# Patient Record
Sex: Male | Born: 2005
Health system: Southern US, Community
[De-identification: ages and names within clinical notes are randomized; demographics above are authoritative.]

---
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
## 2024-05-03 ENCOUNTER — Encounter (HOSPITAL_BASED_OUTPATIENT_CLINIC_OR_DEPARTMENT_OTHER): Payer: Self-pay | Admitting: Emergency Medicine

## 2024-05-03 ENCOUNTER — Emergency Department (HOSPITAL_BASED_OUTPATIENT_CLINIC_OR_DEPARTMENT_OTHER): Admitting: Radiology

## 2024-05-03 ENCOUNTER — Other Ambulatory Visit: Payer: Self-pay

## 2024-05-03 ENCOUNTER — Emergency Department (HOSPITAL_BASED_OUTPATIENT_CLINIC_OR_DEPARTMENT_OTHER)

## 2024-05-03 ENCOUNTER — Emergency Department (HOSPITAL_BASED_OUTPATIENT_CLINIC_OR_DEPARTMENT_OTHER)
Admission: EM | Admit: 2024-05-03 | Discharge: 2024-05-03 | Disposition: A | Discharge status: Home / Self Care | Attending: Emergency Medicine | Admitting: Emergency Medicine

## 2024-05-03 DIAGNOSIS — R410 Disorientation, unspecified: Secondary | ICD-10-CM | POA: Insufficient documentation

## 2024-05-03 LAB — COMPREHENSIVE METABOLIC PANEL WITH GFR
ALT: 21 U/L (ref 0–44)
AST: 21 U/L (ref 15–41)
Albumin: 4.5 g/dL (ref 3.5–5.0)
Alkaline Phosphatase: 46 U/L (ref 38–126)
Anion gap: 10 (ref 5–15)
BUN: 15 mg/dL (ref 6–20)
CO2: 26 mmol/L (ref 22–32)
Calcium: 9.7 mg/dL (ref 8.9–10.3)
Chloride: 103 mmol/L (ref 98–111)
Creatinine, Ser: 0.93 mg/dL (ref 0.61–1.24)
GFR, Estimated: >60 mL/min (ref >60)
Glucose, Bld: 92 mg/dL (ref 70–99)
Potassium: 4.3 mmol/L (ref 3.5–5.1)
Sodium: 139 mmol/L (ref 135–145)
Total Bilirubin: 0.5 mg/dL (ref 0.0–1.2)
Total Protein: 6.8 g/dL (ref 6.5–8.1)

## 2024-05-03 LAB — URINALYSIS, W/ REFLEX TO CULTURE (INFECTION SUSPECTED)
Bacteria, UA: NONE SEEN
Bilirubin Urine: NEGATIVE
Glucose, UA: NEGATIVE mg/dL
Hgb urine dipstick: NEGATIVE
Ketones, ur: NEGATIVE mg/dL
Leukocytes,Ua: NEGATIVE
Nitrite: NEGATIVE
Protein, ur: NEGATIVE mg/dL
Specific Gravity, Urine: 1.013 (ref 1.005–1.030)
pH: 7 (ref 5.0–8.0)

## 2024-05-03 LAB — URINE DRUG SCREEN
Amphetamines: NEGATIVE
Barbiturates: NEGATIVE
Benzodiazepines: NEGATIVE
Cocaine: NEGATIVE
Fentanyl: NEGATIVE
Methadone Scn, Ur: NEGATIVE
Opiates: NEGATIVE
Tetrahydrocannabinol: NEGATIVE

## 2024-05-03 LAB — CBC WITH DIFFERENTIAL/PLATELET
Abs Immature Granulocytes: 0.01 K/uL (ref 0.00–0.07)
Basophils Absolute: 0 K/uL (ref 0.0–0.1)
Basophils Relative: 1 %
Eosinophils Absolute: 0.1 K/uL (ref 0.0–0.5)
Eosinophils Relative: 1 %
HCT: 38.4 % — ABNORMAL LOW (ref 39.0–52.0)
Hemoglobin: 13.1 g/dL (ref 13.0–17.0)
Immature Granulocytes: 0 %
Lymphocytes Relative: 34 %
Lymphs Abs: 2.4 K/uL (ref 0.7–4.0)
MCH: 30.8 pg (ref 26.0–34.0)
MCHC: 34.1 g/dL (ref 30.0–36.0)
MCV: 90.4 fL (ref 80.0–100.0)
Monocytes Absolute: 0.4 K/uL (ref 0.1–1.0)
Monocytes Relative: 6 %
Neutro Abs: 4 K/uL (ref 1.7–7.7)
Neutrophils Relative %: 58 %
Platelets: 178 K/uL (ref 150–400)
RBC: 4.25 MIL/uL (ref 4.22–5.81)
RDW: 13 % (ref 11.5–15.5)
WBC: 6.9 K/uL (ref 4.0–10.5)
nRBC: 0 % (ref 0.0–0.2)

## 2024-05-03 LAB — TSH: TSH: 1.47 u[IU]/mL (ref 0.350–4.500)

## 2024-05-03 MED ORDER — SODIUM CHLORIDE 0.9 % IV BOLUS
1000.0000 mL | Freq: Once | INTRAVENOUS | Status: AC
  Administered 2024-05-03: 1000 mL via INTRAVENOUS

## 2024-05-03 NOTE — ED Triage Notes (Signed)
 Just dont feel like myself  feel like I'm in a daze  Just feel off  Started Sunday Was seen for similar in September resolved on own  Reports etoh prior symptoms starting

## 2024-05-03 NOTE — ED Provider Notes (Signed)
 [facility] AT [facility] Provider Note   CSN: [id number] Arrival date & time: 05/03/24  1713     Patient presents with: Altered Mental Status   Daniel Dudley is a 18 y.o. male.   Patient is a 18 year old male with no significant past medical history who presents with confusion.  He says he feels off.  This started about a week ago.  He said when he is doing things at work he feels like he is confused or he is not focusing on what he is doing.  He said his brain just feels fuzzy.  This started on Sunday after he had had a migraine the day prior.  He also had had about 7 alcoholic beverages the night before.  He denies any recent head injuries.  No nausea or vomiting.  No numbness or weakness to his extremities.  He does not feel off balance.  He said this happened in September and lasted about a week and then went away.  It also started after he drink some alcoholic beverages.  He does remember if he had a migraine at that time as well.  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  He had prescribed him propranolol but it did not seem to help.  He was noted to be hypoxic when he walked back to the room.  He is not reporting any feeling of shortness of breath.  No cough or cold symptoms.  No leg pain or swelling.  His mom has not noticed any confusion.       Prior to Admission medications   Not on File    Allergies: Patient has no known allergies.    Review of Systems  Constitutional:  Negative for chills, diaphoresis, fatigue and fever.  HENT:  Negative for congestion, rhinorrhea and sneezing.   Eyes: Negative.   Respiratory:  Negative for cough, chest tightness and shortness of breath.   Cardiovascular:  Negative for chest pain and leg swelling.  Gastrointestinal:  Negative for abdominal pain, diarrhea, nausea and vomiting.  Genitourinary:  Negative for difficulty urinating, flank pain  and frequency.  Musculoskeletal:  Negative for arthralgias and back pain.  Skin:  Negative for rash.  Neurological:  Negative for dizziness, speech difficulty, weakness, numbness and headaches.  Psychiatric/Behavioral:  Positive for confusion.     Updated Vital Signs BP 124/75 (BP Location: Right Arm)   Pulse (!) 54   Temp 98 F (36.7 C) (Oral)   Resp 15   SpO2 100%   Physical Exam Constitutional:      Appearance: He is well-developed.  HENT:     Head: Normocephalic and atraumatic.  Eyes:     Pupils: Pupils are equal, round, and reactive to light.  Cardiovascular:     Rate and Rhythm: Normal rate and regular rhythm.     Heart sounds: Normal heart sounds.  Pulmonary:     Effort: Pulmonary effort is normal. No respiratory distress.     Breath sounds: Normal breath sounds. No wheezing or rales.  Chest:     Chest wall: No tenderness.  Abdominal:     General: Bowel sounds are normal.     Palpations: Abdomen is soft.     Tenderness: There is no abdominal tenderness. There is no guarding or rebound.  Musculoskeletal:        General: Normal range of motion.     Cervical back: Normal range of motion  and neck supple.  Lymphadenopathy:     Cervical: No cervical adenopathy.  Skin:    General: Skin is warm and dry.     Findings: No rash.  Neurological:     Mental Status: He is alert and oriented to person, place, and time.     Comments: Motor 5/5 all extremities Sensation grossly intact to LT all extremities Finger to Nose intact, no pronator drift CN II-XII grossly intact Gait normal      (all labs ordered are listed, but only abnormal results are displayed) Labs Reviewed  CBC WITH DIFFERENTIAL/PLATELET - Abnormal; Notable for the following components:      Result Value   HCT 38.4 (*)    All other components within normal limits  URINALYSIS, W/ REFLEX TO CULTURE (INFECTION SUSPECTED) - Abnormal; Notable for the following components:   Color, Urine COLORLESS (*)    All  other components within normal limits  COMPREHENSIVE METABOLIC PANEL WITH GFR  URINE DRUG SCREEN  TSH    EKG: EKG Interpretation Date/Time:  Saturday May 03 2024 18:25:15 EST Ventricular Rate:  53 PR Interval:  168 QRS Duration:  104 QT Interval:  403 QTC Calculation: 379 R Axis:   96  Text Interpretation: Sinus rhythm Consider left atrial enlargement Borderline right axis deviation ST elev, probable normal early repol pattern No old tracing to compare Confirmed by Lenor Hollering [phone]) on 05/03/2024 6:40:55 PM  Radiology: DG Chest 2 View Result Date: 05/03/2024 CLINICAL DATA:  Hypoxia, altered level of consciousness EXAM: CHEST - 2 VIEW COMPARISON:  10/14/2019 FINDINGS: The heart size and mediastinal contours are within normal limits. Both lungs are clear. The visualized skeletal structures are unremarkable. IMPRESSION: No active cardiopulmonary disease. Electronically Signed   By: Ozell Daring M.D.   On: 05/03/2024 18:30   CT Head Wo Contrast Result Date: 05/03/2024 EXAM: CT HEAD WITHOUT CONTRAST 05/03/2024 06:20:13 PM TECHNIQUE: CT of the head was performed without the administration of intravenous contrast. Automated exposure control, iterative reconstruction, and/or weight based adjustment of the mA/kV was utilized to reduce the radiation dose to as low as reasonably achievable. COMPARISON: 10/14/2019 CLINICAL HISTORY: Mental status change, unknown cause FINDINGS: BRAIN AND VENTRICLES: No acute hemorrhage. No evidence of acute infarct. No hydrocephalus. No extra-axial collection. No mass effect or midline shift. ORBITS: No acute abnormality. SINUSES: No acute abnormality. SOFT TISSUES AND SKULL: No acute soft tissue abnormality. No skull fracture. IMPRESSION: 1. No acute intracranial abnormality. Electronically signed by: Franky Crease MD 05/03/2024 06:24 PM EST RP Workstation: [id number]     Procedures   Medications Ordered in the ED  sodium chloride  0.9 % bolus 1,000 mL  (0 mLs Intravenous Stopped 05/03/24 1919)                                    Medical Decision Making Amount and/or Complexity of Data Reviewed Labs: ordered. Radiology: ordered.   This patient presents to the ED for concern of confusion, this involves an extensive number of treatment options, and is a complaint that carries with it a high risk of complications and morbidity.  I considered the following differential and admission for this acute, potentially life threatening condition.  The differential diagnosis includes intracranial hemorrhage, brain mass, infection, dehydration, intoxication/substance use, stroke, thyroid  abnormality  MDM:    Patient is a 18 year old who presents with feeling off or fuzzy headed.  This started after he had a negative  drinking about 7 alcoholic drinks.  He also had a migraine just prior to that.  Although he does not have any ongoing headaches.  No focal neurologic deficits.  No ataxia.  He has a mild current headache.  No neck pain or other concerns for meningitis.  Labs are nonconcerning.  TSH is normal.  Vital signs are stable.  Head CT does not show any acute abnormality, chest x-ray does not show any pneumonia or other acute abnormality.  Patient was given IV fluids.  He is completely oriented.  Not sure the etiology of his symptoms.  Not sure if it is related to his alcohol intake or anxiety which he does describe as well.  Discussed with him having close follow-up with his PCP.  If his symptoms continue, he may need a neurology referral.  Return precautions were given.  He was discharged home in good condition.  (Labs, imaging, consults)  Labs: I Ordered, and personally interpreted labs.  The pertinent results include: Normal WBC count, normal x-rays, normal TSH  Imaging Studies ordered: I ordered imaging studies including head CT, chest x-ray I independently visualized and interpreted imaging. I agree with the radiologist  interpretation  Additional history obtained from mom at bedside.  External records from outside source obtained and reviewed including history  Cardiac Monitoring: The patient was maintained on a cardiac monitor.  If on the cardiac monitor, I personally viewed and interpreted the cardiac monitored which showed an underlying rhythm of: Sinus bradycardia  Reevaluation: After the interventions noted above, I reevaluated the patient and found that they have :improved  Social Determinants of Health:    Disposition: Discharged to home  Co morbidities that complicate the patient evaluation History reviewed. No pertinent past medical history.   Medicines Meds ordered this encounter  Medications   sodium chloride  0.9 % bolus 1,000 mL    I have reviewed the patients home medicines and have made adjustments as needed  Problem List / ED Course: Problem List Items Addressed This Visit   None Visit Diagnoses       Confusion    -  Primary                Final diagnoses:  Confusion    ED Discharge Orders     None          Lenor Hollering, MD 05/03/24 2149

## 2024-05-03 NOTE — Discharge Instructions (Addendum)
Make an appointment to have close follow-up with your primary care doctor.  Return to the emergency room if you have any worsening symptoms. 

## 2024-05-03 NOTE — ED Notes (Signed)
 PT ambulated approx 200' with steady gait O2 Sat 99% and HR 64. PT denies SOB or CP at this time
